# Patient Record
Sex: Female | Born: 1950 | Race: White | Hispanic: No | Marital: Married | Smoking: Never smoker
Health system: Southern US, Community
[De-identification: ages and names within clinical notes are randomized; demographics above are authoritative.]

## PROBLEM LIST (undated history)

## (undated) DIAGNOSIS — K219 Gastro-esophageal reflux disease without esophagitis: Secondary | ICD-10-CM

## (undated) DIAGNOSIS — Z8489 Family history of other specified conditions: Secondary | ICD-10-CM

## (undated) DIAGNOSIS — I10 Essential (primary) hypertension: Secondary | ICD-10-CM

## (undated) DIAGNOSIS — E119 Type 2 diabetes mellitus without complications: Secondary | ICD-10-CM

## (undated) HISTORY — DX: Type 2 diabetes mellitus without complications: E11.9

## (undated) HISTORY — DX: Essential (primary) hypertension: I10

## (undated) HISTORY — PX: TUBAL LIGATION: SHX77

## (undated) HISTORY — PX: GALLBLADDER SURGERY: SHX652

## (undated) HISTORY — DX: Gastro-esophageal reflux disease without esophagitis: K21.9

## (undated) HISTORY — PX: BLADDER REPAIR: SHX76

---

## 1993-05-31 HISTORY — PX: VAGINAL HYSTERECTOMY: SUR661

## 2012-06-10 ENCOUNTER — Emergency Department: Payer: Self-pay | Admitting: Emergency Medicine

## 2012-06-10 LAB — BASIC METABOLIC PANEL
Anion Gap: 10 (ref 7–16)
BUN: 20 mg/dL — ABNORMAL HIGH (ref 7–18)
Calcium, Total: 9.6 mg/dL (ref 8.5–10.1)
Chloride: 103 mmol/L (ref 98–107)
EGFR (African American): >60
EGFR (Non-African Amer.): >60
Glucose: 176 mg/dL — ABNORMAL HIGH (ref 65–99)
Sodium: 138 mmol/L (ref 136–145)

## 2012-06-10 LAB — CBC
MCHC: 33.7 g/dL (ref 32.0–36.0)
Platelet: 230 10*3/uL (ref 150–440)
RBC: 4.35 10*6/uL (ref 3.80–5.20)

## 2015-10-14 ENCOUNTER — Other Ambulatory Visit: Payer: Self-pay | Admitting: Obstetrics & Gynecology

## 2015-10-14 DIAGNOSIS — Z1231 Encounter for screening mammogram for malignant neoplasm of breast: Secondary | ICD-10-CM

## 2015-11-10 ENCOUNTER — Ambulatory Visit
Admission: RE | Admit: 2015-11-10 | Discharge: 2015-11-10 | Disposition: A | Discharge status: Home / Self Care | Payer: BLUE CROSS/BLUE SHIELD | Source: Ambulatory Visit | Attending: Obstetrics & Gynecology | Admitting: Obstetrics & Gynecology

## 2015-11-10 ENCOUNTER — Other Ambulatory Visit: Payer: Self-pay | Admitting: Obstetrics & Gynecology

## 2015-11-10 DIAGNOSIS — Z1231 Encounter for screening mammogram for malignant neoplasm of breast: Secondary | ICD-10-CM

## 2017-10-04 ENCOUNTER — Other Ambulatory Visit: Payer: Self-pay

## 2017-10-04 DIAGNOSIS — Z1211 Encounter for screening for malignant neoplasm of colon: Secondary | ICD-10-CM

## 2017-10-17 ENCOUNTER — Ambulatory Visit: Admit: 2017-10-17 | Admitting: Gastroenterology

## 2017-10-17 SURGERY — COLONOSCOPY WITH PROPOFOL
Anesthesia: Choice

## 2017-10-26 ENCOUNTER — Encounter: Payer: Self-pay | Admitting: Obstetrics & Gynecology

## 2017-10-27 ENCOUNTER — Encounter: Payer: Self-pay | Admitting: Obstetrics & Gynecology

## 2017-10-27 ENCOUNTER — Ambulatory Visit (INDEPENDENT_AMBULATORY_CARE_PROVIDER_SITE_OTHER): Admitting: Obstetrics & Gynecology

## 2017-10-27 VITALS — BP 130/80 | Ht 62.0 in | Wt 188.0 lb

## 2017-10-27 DIAGNOSIS — Z1211 Encounter for screening for malignant neoplasm of colon: Secondary | ICD-10-CM | POA: Diagnosis not present

## 2017-10-27 DIAGNOSIS — Z0000 Encounter for general adult medical examination without abnormal findings: Secondary | ICD-10-CM

## 2017-10-27 DIAGNOSIS — Z1272 Encounter for screening for malignant neoplasm of vagina: Secondary | ICD-10-CM | POA: Diagnosis not present

## 2017-10-27 DIAGNOSIS — Z1239 Encounter for other screening for malignant neoplasm of breast: Secondary | ICD-10-CM

## 2017-10-27 DIAGNOSIS — Z1231 Encounter for screening mammogram for malignant neoplasm of breast: Secondary | ICD-10-CM

## 2017-10-27 NOTE — Patient Instructions (Signed)
PAP every three years Mammogram every year    Call [phone] to schedule at [place] Colonoscopy every 10 years Labs yearly (with PCP) 

## 2017-10-27 NOTE — Progress Notes (Signed)
HPI:      [name] is a 67 y.o. G1P1001 who LMP was in the past, she presents today for her annual examination.  The patient has no complaints today. The patient is sexually active. Herlast pap: approximate date 2016 and was normal and last mammogram: approximate date 2018 and was normal.  The patient does perform self breast exams.  There is no notable family history of breast or ovarian cancer in her family. The patient is not taking hormone replacement therapy. Patient denies post-menopausal vaginal bleeding.   The patient has regular exercise: yes. The patient denies current symptoms of depression.    GYN Hx: Last Colonoscopy:never ago.  Last DEXA: never ago.    PMHx: Past Medical History:  Diagnosis Date  . Diabetes type 2, controlled (HCC)   . Esophageal reflux   . Hypertension    Past Surgical History:  Procedure Laterality Date  . GALLBLADDER SURGERY    . TUBAL LIGATION    . VAGINAL HYSTERECTOMY  1995   Dr Haskel Khan   Family History  Problem Relation Age of Onset  . Breast cancer Cousin   . Hypertension Mother   . Atrial fibrillation Father   . Heart disease Paternal Grandmother   . Kidney cancer Paternal Grandfather    Social History   Tobacco Use  . Smoking status: Never Smoker  . Smokeless tobacco: Never Used  Substance Use Topics  . Alcohol use: Yes    Frequency: Never  . Drug use: Never    Current Outpatient Medications:  .  phentermine 15 MG capsule, Take 15 mg by mouth every morning., Disp: , Rfl:  Allergies: Penicillins  Review of Systems  Constitutional: Negative for chills, fever and malaise/fatigue.  HENT: Negative for congestion, sinus pain and sore throat.   Eyes: Negative for blurred vision and pain.  Respiratory: Negative for cough and wheezing.   Cardiovascular: Negative for chest pain and leg swelling.  Gastrointestinal: Negative for abdominal pain, constipation, diarrhea, heartburn, nausea and vomiting.  Genitourinary: Negative  for dysuria, frequency, hematuria and urgency.  Musculoskeletal: Negative for back pain, joint pain, myalgias and neck pain.  Skin: Negative for itching and rash.  Neurological: Negative for dizziness, tremors and weakness.  Endo/Heme/Allergies: Does not bruise/bleed easily.  Psychiatric/Behavioral: Negative for depression. The patient is not nervous/anxious and does not have insomnia.     Objective: BP 130/80   Ht  (1.575 m)   Wt 188 lb (85.3 kg)   BMI 34.39 kg/m   Filed Weights   10/27/17 1538  Weight: 188 lb (85.3 kg)   Body mass index is 34.39 kg/m. Physical Exam  Constitutional: She is oriented to person, place, and time. She appears well-developed and well-nourished. No distress.  Genitourinary: Rectum normal and vagina normal. Pelvic exam was performed with patient supine. There is no rash or lesion on the right labia. There is no rash or lesion on the left labia. Vagina exhibits no lesion. No bleeding in the vagina. Right adnexum does not display mass and does not display tenderness. Left adnexum does not display mass and does not display tenderness.  Genitourinary Comments: Absent Uterus Absent cervix Vaginal cuff well healed  HENT:  Head: Normocephalic and atraumatic. Head is without laceration.  Right Ear: Hearing normal.  Left Ear: Hearing normal.  Nose: No epistaxis.  No foreign bodies.  Mouth/Throat: Uvula is midline, oropharynx is clear and moist and mucous membranes are normal.  Eyes: Pupils are equal, round, and reactive to light.  Neck: Normal range of motion. Neck supple. No thyromegaly present.  Cardiovascular: Normal rate and regular rhythm. Exam reveals no gallop and no friction rub.  No murmur heard. Pulmonary/Chest: Effort normal and breath sounds normal. No respiratory distress. She has no wheezes. Right breast exhibits no mass, no skin change and no tenderness. Left breast exhibits no mass, no skin change and no tenderness.  Abdominal: Soft. Bowel  sounds are normal. She exhibits no distension. There is no tenderness. There is no rebound.  Musculoskeletal: Normal range of motion.  Neurological: She is alert and oriented to person, place, and time. No cranial nerve deficit.  Skin: Skin is warm and dry.  Psychiatric: She has a normal mood and affect. Judgment normal.  Vitals reviewed.   Assessment: Annual Exam 1. Annual physical exam   2. Screening for vaginal cancer   3. Screening for breast cancer   4. Screen for colon cancer     Plan:            1.  Cervical Screening-  Pap smear done today  2. Breast screening- Exam annually and mammogram scheduled  3. Colonoscopy every 10 years, WILL ARRANGE CONSULT SOON,  Hemoccult testing after age 7  4. Labs managed by PCP  5. Counseling for hormonal therapy: none     F/U  Return in about 1 year (around 10/28/2018) for Annual.  Annamarie Major, MD, [facility], [facility] 10/27/2017  4:17 PM

## 2017-11-01 ENCOUNTER — Telehealth: Payer: Self-pay | Admitting: Gastroenterology

## 2017-11-01 LAB — PAP IG (IMAGE GUIDED): PAP Smear Comment: 0

## 2017-11-01 NOTE — Telephone Encounter (Signed)
Patient LVM for you to call and schedule her colonoscopy

## 2017-11-02 ENCOUNTER — Other Ambulatory Visit: Payer: Self-pay

## 2017-11-02 DIAGNOSIS — Z1211 Encounter for screening for malignant neoplasm of colon: Secondary | ICD-10-CM

## 2017-11-02 NOTE — Telephone Encounter (Signed)
Patient has been scheduled for 11/28/17 [facility]

## 2017-11-24 ENCOUNTER — Telehealth: Payer: Self-pay

## 2017-11-24 NOTE — Telephone Encounter (Signed)
Patients colonoscopy has been rescheduled from 12/08/17 to 04/07/18 due to insurance.  [facility]  Thanks [employer]

## 2018-02-06 ENCOUNTER — Other Ambulatory Visit: Payer: Self-pay

## 2018-02-06 ENCOUNTER — Telehealth: Payer: Self-pay

## 2018-02-06 NOTE — Telephone Encounter (Signed)
Left detailed message for pt to schedule her mammo

## 2018-02-06 NOTE — Telephone Encounter (Signed)
-----   Message from Nadara Mustard, MD sent at 02/01/2018  8:03 AM EDT ----- Regarding: mmg Received notice she has not received MMG yet as ordered at her Annual. Please check and encourage her to do this, and document conversation.

## 2018-03-23 ENCOUNTER — Encounter

## 2018-04-10 ENCOUNTER — Encounter: Payer: Self-pay | Admitting: Obstetrics & Gynecology

## 2018-04-10 ENCOUNTER — Ambulatory Visit
Admission: RE | Admit: 2018-04-10 | Discharge: 2018-04-10 | Disposition: A | Discharge status: Home / Self Care | Payer: Medicare Other | Source: Ambulatory Visit | Attending: Obstetrics & Gynecology | Admitting: Obstetrics & Gynecology

## 2018-04-10 DIAGNOSIS — Z1239 Encounter for other screening for malignant neoplasm of breast: Secondary | ICD-10-CM

## 2018-04-10 DIAGNOSIS — Z1231 Encounter for screening mammogram for malignant neoplasm of breast: Secondary | ICD-10-CM | POA: Diagnosis not present

## 2018-06-02 ENCOUNTER — Telehealth: Payer: Self-pay

## 2018-06-02 DIAGNOSIS — Z1211 Encounter for screening for malignant neoplasm of colon: Secondary | ICD-10-CM

## 2018-06-02 MED ORDER — NA SULFATE-K SULFATE-MG SULF 17.5-3.13-1.6 GM/177ML PO SOLN: 1.0000 | Freq: Once | ORAL | 0 refills | Status: AC

## 2018-06-02 NOTE — Telephone Encounter (Signed)
Received call from patient requesting her rx for her bowel prep to be sent to pharmacy for her.  Asked patient if she had instructions, and she said she did not know if she still had them.  She said that someone told her to call us and we would send rx, she inquired about insurance coverage.  I reviewed the referral and the referral was from 6 months ago, thus a new referral will need to be ordered for precert purposes.  I advised her to bring her insurance card with her to her procedure and told her that she would be contacted if there were questions regarding her insurance.  Thanks [employer]

## 2018-06-05 ENCOUNTER — Encounter: Payer: Self-pay | Admitting: Anesthesiology

## 2018-06-05 ENCOUNTER — Telehealth: Payer: Self-pay | Admitting: Gastroenterology

## 2018-06-05 NOTE — Telephone Encounter (Signed)
Patient called & state she is scheduled for a colonoscopy on 06-09-2018 with Dr Servando Snare. She has a sinus infection & wants to know if it is ok to take Mucinex.

## 2018-06-05 NOTE — Telephone Encounter (Signed)
Patient has been advised that she may take her Mucinex.  Advised her if she is not better closer to procedure to call office to reschedule.  No cancellation fee will be applied.  Thanks [employer]

## 2018-06-07 ENCOUNTER — Telehealth: Payer: Self-pay | Admitting: Gastroenterology

## 2018-06-07 NOTE — Telephone Encounter (Signed)
PT left vm for Marcelino DusterMichelle she still thinks she needs to r/s apt for 06/09/18 due to being sick

## 2018-06-07 NOTE — Telephone Encounter (Signed)
Returned patients call to reschedule her colonoscopy date from 06/09/18 [facility]  She said she would like to call office back to reschedule at a later time.  She is still sick. [facility].  No Cancellation Fee  Thanks Marcelino DusterMichelle

## 2018-06-09 ENCOUNTER — Ambulatory Visit: Admission: RE | Admit: 2018-06-09 | Payer: Medicare Other | Source: Home / Self Care | Admitting: Gastroenterology

## 2018-06-09 SURGERY — COLONOSCOPY WITH PROPOFOL
Anesthesia: Choice

## 2018-08-09 ENCOUNTER — Telehealth: Payer: Self-pay | Admitting: Gastroenterology

## 2018-08-09 ENCOUNTER — Telehealth: Payer: Self-pay

## 2018-08-09 NOTE — Telephone Encounter (Signed)
Pt left vm for Marcelino Duster to reschedule her colonoscopy

## 2018-08-09 NOTE — Telephone Encounter (Signed)
Returned patients call to reschedule her screening colonoscopy.  LVM for her to call office back.  Re-opened referral and its in the work que.  Thanks [employer]

## 2018-08-10 NOTE — Telephone Encounter (Signed)
LVM returning patients call to schedule her colonoscopy.  Thanks Lorain Fettes 

## 2018-08-29 ENCOUNTER — Telehealth: Payer: Self-pay

## 2018-08-29 ENCOUNTER — Other Ambulatory Visit: Payer: Self-pay

## 2018-08-29 DIAGNOSIS — Z1211 Encounter for screening for malignant neoplasm of colon: Secondary | ICD-10-CM

## 2018-08-29 NOTE — Telephone Encounter (Signed)
Contacted patient to discuss and review her colonoscopy instructions for 12/08/18 with Dr. Servando Snare.  LVM for her to call me back to go over them and answer any questions she may have.  Orders entered, letter prepared.  Thanks [employer]

## 2018-11-27 ENCOUNTER — Telehealth: Payer: Self-pay | Admitting: Gastroenterology

## 2018-11-27 NOTE — Telephone Encounter (Signed)
Patient contacted office stated that she would like to cancel her colonoscopy because she is is comfortable having it yet.  [facility] has been made aware of cancellation.  Thanks [employer]

## 2018-11-27 NOTE — Telephone Encounter (Signed)
Pt is calling to cancel her procedure for 12/08/18 she does not feel comfortable to do it yet and states she will call us back to reschedule

## 2018-12-05 ENCOUNTER — Other Ambulatory Visit: Admission: RE | Admit: 2018-12-05 | Source: Ambulatory Visit

## 2018-12-08 ENCOUNTER — Ambulatory Visit: Admit: 2018-12-08 | Admitting: Gastroenterology

## 2018-12-08 SURGERY — COLONOSCOPY WITH PROPOFOL
Anesthesia: Choice

## 2020-09-17 ENCOUNTER — Other Ambulatory Visit: Payer: Self-pay | Admitting: Internal Medicine

## 2020-09-17 DIAGNOSIS — Z1231 Encounter for screening mammogram for malignant neoplasm of breast: Secondary | ICD-10-CM

## 2020-09-25 ENCOUNTER — Other Ambulatory Visit: Payer: Self-pay

## 2020-09-25 ENCOUNTER — Ambulatory Visit
Admission: RE | Admit: 2020-09-25 | Discharge: 2020-09-25 | Disposition: A | Discharge status: Home / Self Care | Payer: Medicare Other | Source: Ambulatory Visit | Attending: Internal Medicine | Admitting: Internal Medicine

## 2020-09-25 DIAGNOSIS — Z1231 Encounter for screening mammogram for malignant neoplasm of breast: Secondary | ICD-10-CM

## 2021-08-19 ENCOUNTER — Other Ambulatory Visit: Payer: Self-pay | Admitting: Internal Medicine

## 2021-08-19 DIAGNOSIS — E2839 Other primary ovarian failure: Secondary | ICD-10-CM

## 2021-08-19 DIAGNOSIS — Z1231 Encounter for screening mammogram for malignant neoplasm of breast: Secondary | ICD-10-CM

## 2021-09-08 ENCOUNTER — Telehealth: Payer: Self-pay

## 2021-09-08 NOTE — Telephone Encounter (Signed)
CALLED PATIENT NO ANSWER LEFT VOICEMAIL FOR A CALL BACK ? ?

## 2021-10-28 ENCOUNTER — Ambulatory Visit
Admission: RE | Admit: 2021-10-28 | Discharge: 2021-10-28 | Disposition: A | Discharge status: Home / Self Care | Source: Ambulatory Visit | Attending: Internal Medicine | Admitting: Internal Medicine

## 2021-10-28 DIAGNOSIS — E2839 Other primary ovarian failure: Secondary | ICD-10-CM | POA: Insufficient documentation

## 2021-10-28 DIAGNOSIS — Z1231 Encounter for screening mammogram for malignant neoplasm of breast: Secondary | ICD-10-CM | POA: Diagnosis present

## 2022-01-07 ENCOUNTER — Encounter: Payer: Self-pay | Admitting: Internal Medicine

## 2022-01-08 ENCOUNTER — Telehealth: Payer: Self-pay

## 2022-01-08 ENCOUNTER — Other Ambulatory Visit: Payer: Self-pay

## 2022-01-08 DIAGNOSIS — Z1211 Encounter for screening for malignant neoplasm of colon: Secondary | ICD-10-CM

## 2022-01-08 MED ORDER — NA SULFATE-K SULFATE-MG SULF 17.5-3.13-1.6 GM/177ML PO SOLN: 1.0000 | Freq: Once | ORAL | 0 refills | Status: AC

## 2022-01-08 NOTE — Telephone Encounter (Signed)
Gastroenterology Pre-Procedure Review  Request Date: 02/19/22 Requesting Physician: Dr. Servando Snare  PATIENT REVIEW QUESTIONS: The patient responded to the following health history questions as indicated:    1. Are you having any GI issues? no 2. Do you have a personal history of Polyps? no 3. Do you have a family history of Colon Cancer or Polyps? no 4. Diabetes Mellitus? yes (type 2 takes metformin has been advised to hold 2 days prior to colonoscopy per anesthesia guidelines) 5. Joint replacements in the past 12 months?no 6. Major health problems in the past 3 months?no 7. Any artificial heart valves, MVP, or defibrillator?no    MEDICATIONS & ALLERGIES:    Patient reports the following regarding taking any anticoagulation/antiplatelet therapy:   Plavix, Coumadin, Eliquis, Xarelto, Lovenox, Pradaxa, Brilinta, or Effient? no Aspirin? no  Patient confirms/reports the following medications:  Current Outpatient Medications  Medication Sig Dispense Refill   naproxen sodium (ALEVE) 220 MG tablet Take 220 mg by mouth daily as needed.     phentermine 15 MG capsule Take 15 mg by mouth every morning.     No current facility-administered medications for this visit.    Patient confirms/reports the following allergies:  Allergies  Allergen Reactions   Penicillins Rash    No orders of the defined types were placed in this encounter.   AUTHORIZATION INFORMATION Primary Insurance: 1D#: Group #:  Secondary Insurance: 1D#: Group #:  SCHEDULE INFORMATION: Date: 02/19/22 Time: Location: [facility]

## 2022-02-15 ENCOUNTER — Encounter: Payer: Self-pay | Admitting: Gastroenterology

## 2022-02-16 ENCOUNTER — Encounter: Payer: Self-pay | Admitting: Gastroenterology

## 2022-02-18 NOTE — Anesthesia Preprocedure Evaluation (Addendum)
Anesthesia Evaluation  Patient identified by MRN, date of birth, ID band Patient awake    Reviewed: Allergy & Precautions, NPO status , Patient's Chart, lab work & pertinent test results  Airway Mallampati: III  TM Distance: >3 FB Neck ROM: full    Dental  (+) Missing   Pulmonary neg pulmonary ROS,    Pulmonary exam normal        Cardiovascular Exercise Tolerance: Good Normal cardiovascular exam     Neuro/Psych negative neurological ROS  negative psych ROS   GI/Hepatic negative GI ROS, Neg liver ROS,   Endo/Other  diabetes, Well Controlled, Type 2, Oral Hypoglycemic Agents  Renal/GU negative Renal ROS  negative genitourinary   Musculoskeletal   Abdominal Normal abdominal exam  (+)   Peds  Hematology negative hematology ROS (+)   Anesthesia Other Findings Past Medical History: No date: Diabetes type 2, controlled (HCC) No date: Esophageal reflux No date: Family history of adverse reaction to anesthesia     Comment:  Sister - PONV No date: Hypertension  Past Surgical History: No date: BLADDER REPAIR     Comment:  bladder tack No date: GALLBLADDER SURGERY No date: TUBAL LIGATION 1995: VAGINAL HYSTERECTOMY     Comment:  Dr Vernie Ammons  BMI    Body Mass Index: 28.80 kg/m      Reproductive/Obstetrics negative OB ROS                            Anesthesia Physical Anesthesia Plan  ASA: 2  Anesthesia Plan: General   Post-op Pain Management: Minimal or no pain anticipated   Induction: Intravenous  PONV Risk Score and Plan: Propofol infusion and TIVA  Airway Management Planned: Natural Airway  Additional Equipment:   Intra-op Plan:   Post-operative Plan:   Informed Consent: I have reviewed the patients History and Physical, chart, labs and discussed the procedure including the risks, benefits and alternatives for the proposed anesthesia with the patient or authorized  representative who has indicated his/her understanding and acceptance.     Dental Advisory Given  Plan Discussed with: Anesthesiologist, CRNA and Surgeon  Anesthesia Plan Comments:       Anesthesia Quick Evaluation

## 2022-02-19 ENCOUNTER — Ambulatory Visit: Payer: Medicare Other | Admitting: Anesthesiology

## 2022-02-19 ENCOUNTER — Ambulatory Visit (AMBULATORY_SURGERY_CENTER): Payer: Medicare Other | Admitting: Anesthesiology

## 2022-02-19 ENCOUNTER — Other Ambulatory Visit: Payer: Self-pay

## 2022-02-19 ENCOUNTER — Ambulatory Visit
Admission: RE | Admit: 2022-02-19 | Discharge: 2022-02-19 | Disposition: A | Discharge status: Home / Self Care | Payer: Medicare Other | Attending: Gastroenterology | Admitting: Gastroenterology

## 2022-02-19 ENCOUNTER — Encounter
Admission: RE | Disposition: A | Discharge status: Home / Self Care | Payer: Self-pay | Source: Home / Self Care | Attending: Gastroenterology

## 2022-02-19 ENCOUNTER — Encounter: Payer: Self-pay | Admitting: Gastroenterology

## 2022-02-19 DIAGNOSIS — I10 Essential (primary) hypertension: Secondary | ICD-10-CM

## 2022-02-19 DIAGNOSIS — Z7984 Long term (current) use of oral hypoglycemic drugs: Secondary | ICD-10-CM | POA: Insufficient documentation

## 2022-02-19 DIAGNOSIS — Z1211 Encounter for screening for malignant neoplasm of colon: Secondary | ICD-10-CM

## 2022-02-19 DIAGNOSIS — K219 Gastro-esophageal reflux disease without esophagitis: Secondary | ICD-10-CM | POA: Diagnosis not present

## 2022-02-19 DIAGNOSIS — E119 Type 2 diabetes mellitus without complications: Secondary | ICD-10-CM

## 2022-02-19 HISTORY — PX: COLONOSCOPY WITH PROPOFOL: SHX5780

## 2022-02-19 HISTORY — DX: Family history of other specified conditions: Z84.89

## 2022-02-19 LAB — GLUCOSE, CAPILLARY
Glucose-Capillary: 152 mg/dL — ABNORMAL HIGH (ref 70–99)
Glucose-Capillary: 163 mg/dL — ABNORMAL HIGH (ref 70–99)

## 2022-02-19 SURGERY — COLONOSCOPY WITH PROPOFOL
Anesthesia: Monitor Anesthesia Care | Site: Rectum

## 2022-02-19 MED ORDER — PROPOFOL 10 MG/ML IV BOLUS
INTRAVENOUS | Status: DC | PRN
  Administered 2022-02-19 (×2): 25 mg via INTRAVENOUS
  Administered 2022-02-19: 100 mg via INTRAVENOUS
  Administered 2022-02-19: 50 mg via INTRAVENOUS
  Administered 2022-02-19: 25 mg via INTRAVENOUS

## 2022-02-19 MED ORDER — LACTATED RINGERS IV SOLN: INTRAVENOUS | Status: DC

## 2022-02-19 MED ORDER — LACTATED RINGERS IV SOLN: INTRAVENOUS | Status: DC | PRN

## 2022-02-19 MED ORDER — SODIUM CHLORIDE 0.9 % IV SOLN: INTRAVENOUS | Status: DC

## 2022-02-19 SURGICAL SUPPLY — 21 items

## 2022-02-19 NOTE — Anesthesia Postprocedure Evaluation (Signed)
Anesthesia Post Note  Patient: Crystal Montoya  Procedure(s) Performed: COLONOSCOPY WITH PROPOFOL (Rectum)     Patient location during evaluation: PACU Anesthesia Type: MAC Level of consciousness: awake and alert Pain management: pain level controlled Vital Signs Assessment: post-procedure vital signs reviewed and stable Respiratory status: spontaneous breathing, nonlabored ventilation and respiratory function stable Cardiovascular status: blood pressure returned to baseline and stable Postop Assessment: no apparent nausea or vomiting Anesthetic complications: no   No notable events documented.  [place] Ouch

## 2022-02-19 NOTE — H&P (Signed)
Crystal Lame, MD [facility] [address], [place], [place] [address] Phone: [phone] Fax : [phone]  Primary Care Physician:  Albina Billet, MD Primary Gastroenterologist:  Dr. Allen Norris  Pre-Procedure History & Physical: HPI:  Crystal Montoya is a 71 y.o. female is here for a screening colonoscopy.   Past Medical History:  Diagnosis Date   Diabetes type 2, controlled ([place])    Esophageal reflux    Family history of adverse reaction to anesthesia    Sister - PONV   Hypertension     Past Surgical History:  Procedure Laterality Date   BLADDER REPAIR     bladder tack   GALLBLADDER SURGERY     TUBAL LIGATION     VAGINAL HYSTERECTOMY  1995   Dr Vernie Ammons    Prior to Admission medications   Medication Sig Start Date End Date Taking? Authorizing Provider  chlorpheniramine (CHLOR-TRIMETON) 2 MG/5ML syrup Take 2 mg by mouth every 4 (four) hours as needed for allergies.   Yes [provider]  Cinnamon 500 MG capsule Take 2,000 mg by mouth daily.   Yes [provider]  docusate sodium (COLACE) 100 MG capsule Take 100 mg by mouth 2 (two) times daily.   Yes [provider]  lisinopril (ZESTRIL) 10 MG tablet Take 10 mg by mouth daily. 08/25/21  Yes [provider]  metFORMIN (GLUCOPHAGE) 500 MG tablet TAKE 1 TABLET BY MOUTH EVERY MORNING AND 2 TABLETS EVERY EVENING 08/21/21  Yes [provider]  naproxen sodium (ALEVE) 220 MG tablet Take 220 mg by mouth daily as needed.   Yes [provider]  Probiotic Product (ALIGN PO) Take by mouth daily.   Yes [provider]  rosuvastatin (CRESTOR) 10 MG tablet Take 10 mg by mouth at bedtime. 08/25/21  Yes [provider]    Allergies as of 01/08/2022 - Review Complete 06/02/2018  Allergen Reaction Noted   Penicillins Rash 10/26/2017    Family History  Problem Relation Age of Onset   Hypertension Mother    Atrial fibrillation Father    Heart disease Paternal  Grandmother    Kidney cancer Paternal Grandfather     Social History   Socioeconomic History   Marital status: Married    Spouse name: Not on file   Number of children: Not on file   Years of education: Not on file   Highest education level: Not on file  Occupational History   Not on file  Tobacco Use   Smoking status: Never   Smokeless tobacco: Never  Vaping Use   Vaping Use: Never used  Substance and Sexual Activity   Alcohol use: Yes    Comment: rarely   Drug use: Never   Sexual activity: Yes  Other Topics Concern   Not on file  Social History Narrative   Not on file   Social Determinants of Health   Financial Resource Strain: Not on file  Food Insecurity: Not on file  Transportation Needs: Not on file  Physical Activity: Not on file  Stress: Not on file  Social Connections: Not on file  Intimate Partner Violence: Not on file    Review of Systems: See HPI, otherwise negative ROS  Physical Exam: BP (!) 150/69   Pulse 83   Temp (!) 97 F (36.1 C) (Temporal)   Resp 16   Ht 5' 2.5" (1.588 m)   Wt 73.5 kg   SpO2 96%   BMI 29.16 kg/m  General:   Alert,  pleasant and cooperative in NAD Head:  Normocephalic and atraumatic. Neck:  Supple; no masses or thyromegaly. Lungs:  Clear throughout to auscultation.    Heart:  Regular rate and rhythm. Abdomen:  Soft, nontender and nondistended. Normal bowel sounds, without guarding, and without rebound.   Neurologic:  Alert and  oriented x4;  grossly normal neurologically.  Impression/Plan: Crystal Montoya is now here to undergo a screening colonoscopy.  Risks, benefits, and alternatives regarding colonoscopy have been reviewed with the patient.  Questions have been answered.  All parties agreeable.

## 2022-02-19 NOTE — Transfer of Care (Signed)
Immediate Anesthesia Transfer of Care Note  Patient: Crystal Montoya  Procedure(s) Performed: COLONOSCOPY WITH PROPOFOL (Rectum)  Patient Location: PACU  Anesthesia Type: MAC  Level of Consciousness: awake, alert  and patient cooperative  Airway and Oxygen Therapy: Patient Spontanous Breathing and Patient connected to supplemental oxygen  Post-op Assessment: Post-op Vital signs reviewed, Patient's Cardiovascular Status Stable, Respiratory Function Stable, Patent Airway and No signs of Nausea or vomiting  Post-op Vital Signs: Reviewed and stable  Complications: No notable events documented.

## 2022-02-19 NOTE — Op Note (Signed)
[facility] Patient Name: Crystal Montoya Procedure Date: 02/19/2022 7:16 AM MRN: [id number] Account #: [id number] Date of Birth: May 05, 1951 Admit Type: Outpatient Age: 71 Room: [facility] OR ROOM 01 Gender: Female Note Status: Finalized Instrument Name: [id number] Procedure:             Colonoscopy Indications:           Screening for colorectal malignant neoplasm Providers:             Midge Minium MD, MD Referring MD:          Jillene Bucks. Arlana Pouch, MD (Referring MD) Medicines:             Propofol per Anesthesia Complications:         No immediate complications. Procedure:             Pre-Anesthesia Assessment:                        - Prior to the procedure, a History and Physical was                         performed, and patient medications and allergies were                         reviewed. The patient's tolerance of previous                         anesthesia was also reviewed. The risks and benefits                         of the procedure and the sedation options and risks                         were discussed with the patient. All questions were                         answered, and informed consent was obtained. Prior                         Anticoagulants: The patient has taken no previous                         anticoagulant or antiplatelet agents. ASA Grade                         Assessment: II - A patient with mild systemic disease.                         After reviewing the risks and benefits, the patient                         was deemed in satisfactory condition to undergo the                         procedure.                        After obtaining informed consent, the colonoscope was  passed under direct vision. Throughout the procedure,                         the patient's blood pressure, pulse, and oxygen                         saturations were monitored continuously. The                         Colonoscope was  introduced through the anus and                         advanced to the the cecum, identified by appendiceal                         orifice and ileocecal valve. The colonoscopy was                         performed without difficulty. The patient tolerated                         the procedure well. The quality of the bowel                         preparation was excellent. Findings:      The perianal and digital rectal examinations were normal.      The colon (entire examined portion) appeared normal. Impression:            - The entire examined colon is normal.                        - No specimens collected. Recommendation:        - Discharge patient to home.                        - Resume previous diet.                        - Continue present medications.                        - Repeat colonoscopy is not recommended due to current                         age (31 years or older) for screening purposes. Procedure Code(s):     --- Professional ---                        [id number], Colonoscopy, flexible; diagnostic, including                         collection of specimen(s) by brushing or washing, when                         performed (separate procedure) Diagnosis Code(s):     --- Professional ---                        Z12.11, Encounter for screening for malignant neoplasm  of colon CPT copyright 2019 American Medical Association. All rights reserved. The codes documented in this report are preliminary and upon coder review may  be revised to meet current compliance requirements. Midge Minium MD, MD 02/19/2022 7:51:43 AM This report has been signed electronically. Number of Addenda: 0 Note Initiated On: 02/19/2022 7:16 AM Scope Withdrawal Time: 0 hours 7 minutes 29 seconds  Total Procedure Duration: 0 hours 12 minutes 5 seconds  Estimated Blood Loss:  Estimated blood loss: none.      [facility]

## 2023-02-22 ENCOUNTER — Other Ambulatory Visit: Payer: Self-pay | Admitting: Internal Medicine

## 2023-02-22 DIAGNOSIS — Z1231 Encounter for screening mammogram for malignant neoplasm of breast: Secondary | ICD-10-CM

## 2023-03-07 ENCOUNTER — Ambulatory Visit
Admission: RE | Admit: 2023-03-07 | Discharge: 2023-03-07 | Disposition: A | Discharge status: Home / Self Care | Payer: Medicare Other | Source: Ambulatory Visit | Attending: Internal Medicine | Admitting: Internal Medicine

## 2023-03-07 DIAGNOSIS — Z1231 Encounter for screening mammogram for malignant neoplasm of breast: Secondary | ICD-10-CM | POA: Insufficient documentation

## 2023-08-01 IMAGING — MG MM DIGITAL SCREENING BILAT W/ TOMO AND CAD
6 of 10 series · 6 of 30 positions shown · non-contrast
Comparison: Previous exam(s).

CLINICAL DATA: Screening.

EXAM:
DIGITAL SCREENING BILATERAL MAMMOGRAM WITH TOMOSYNTHESIS AND CAD
TECHNIQUE: Bilateral screening digital craniocaudal and mediolateral oblique
mammograms were obtained. Bilateral screening digital breast
tomosynthesis was performed. The images were evaluated with
computer-aided detection.

[L MLO synth-2D (1 of 2)]
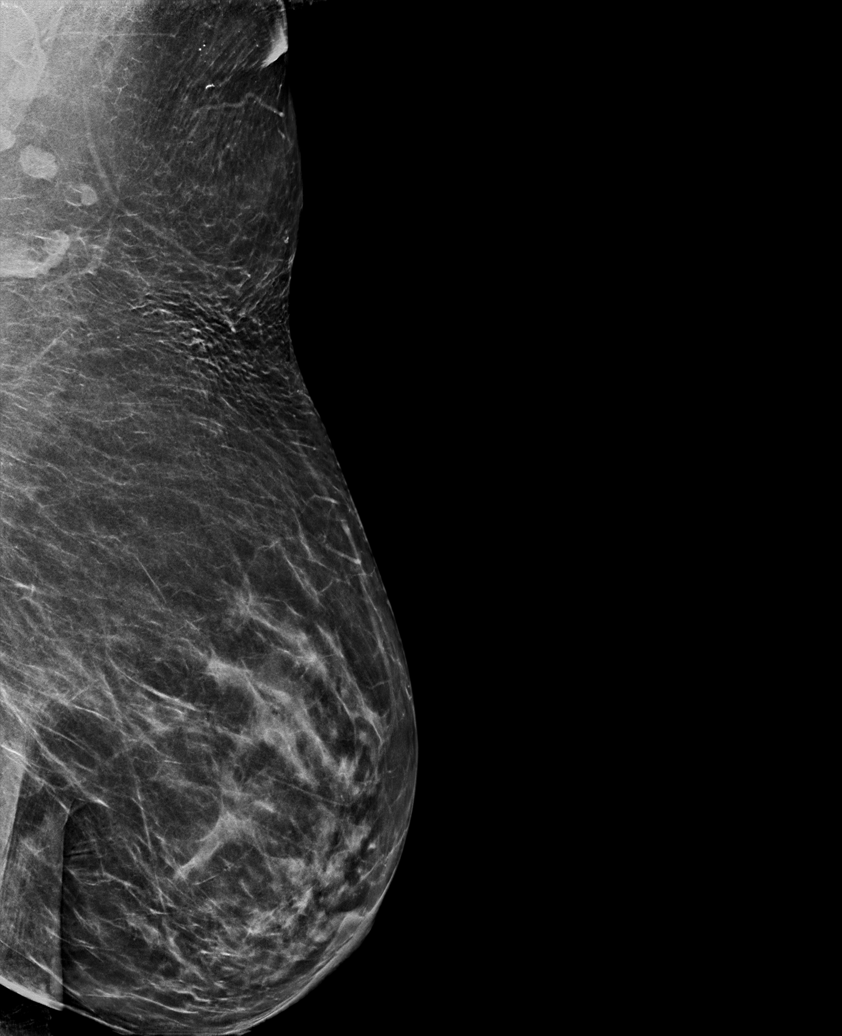

[R MLO synth-2D]
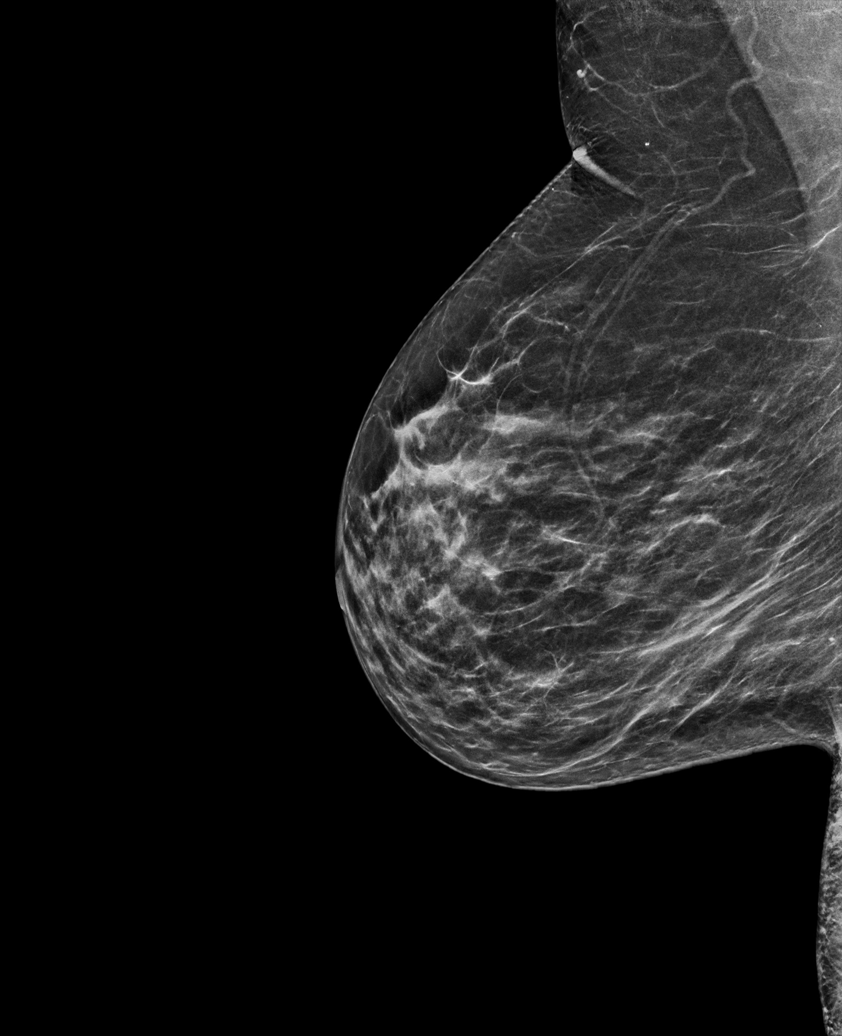

[R CC synth-2D]
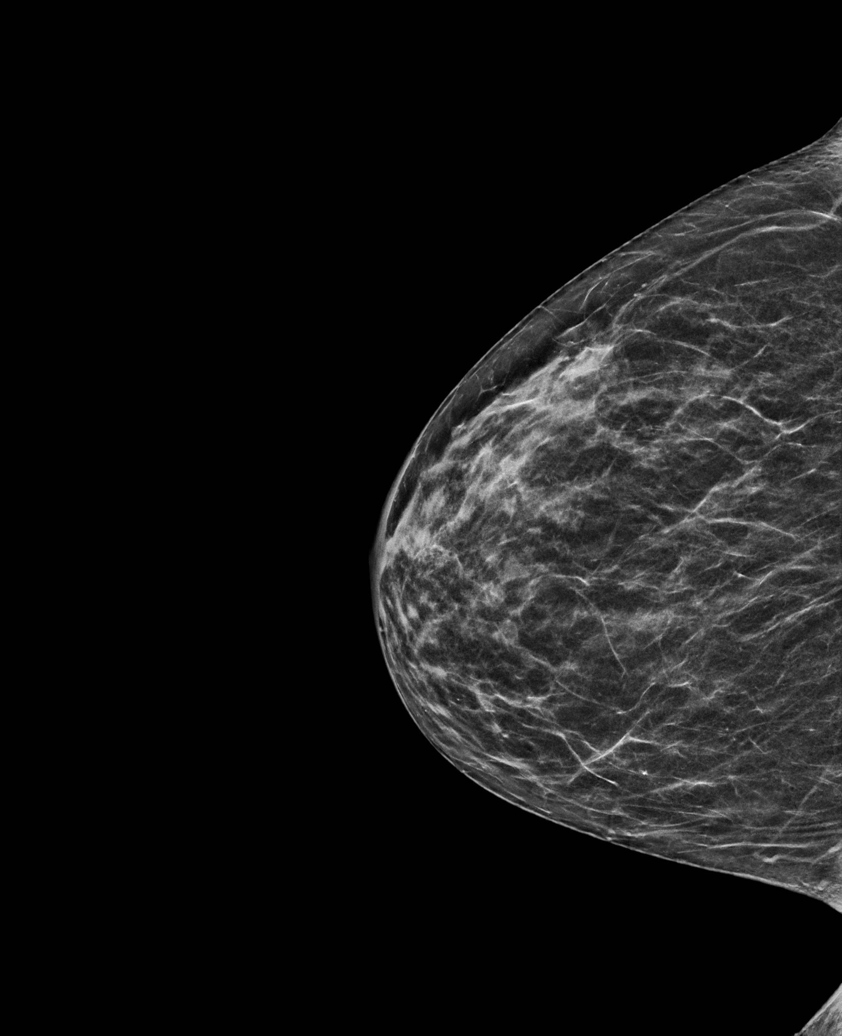

[L MLO synth-2D (2 of 2)]
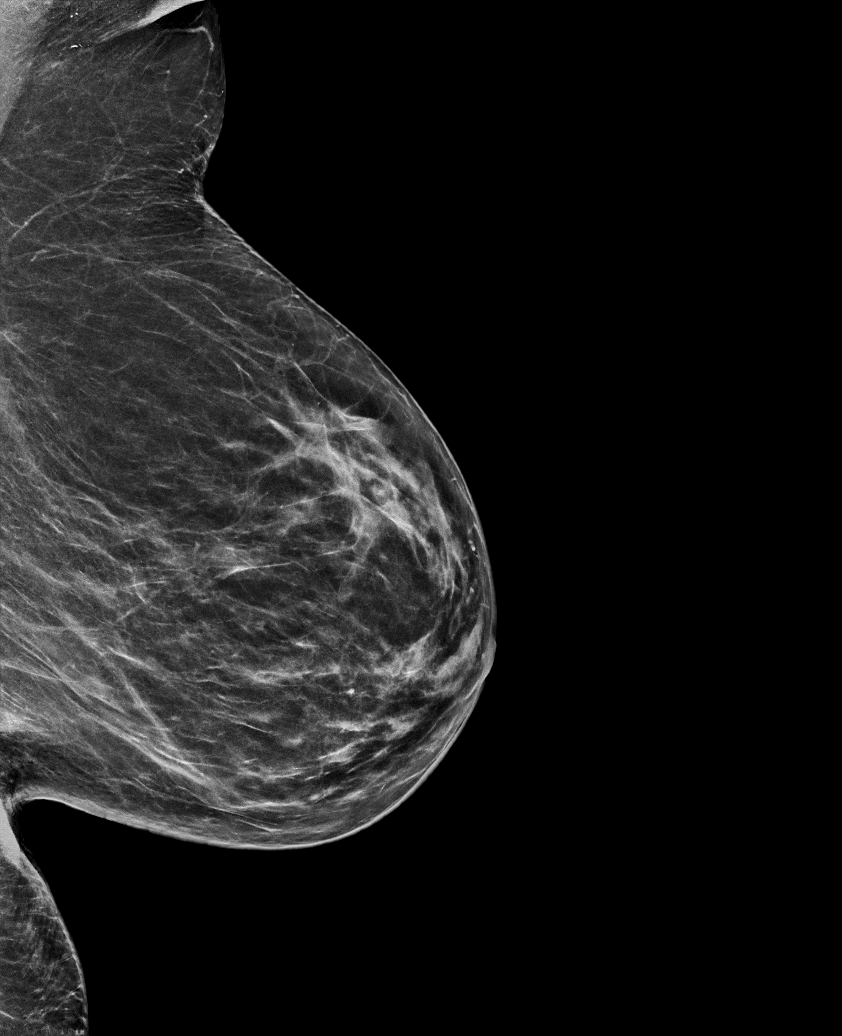

[L CC synth-2D]
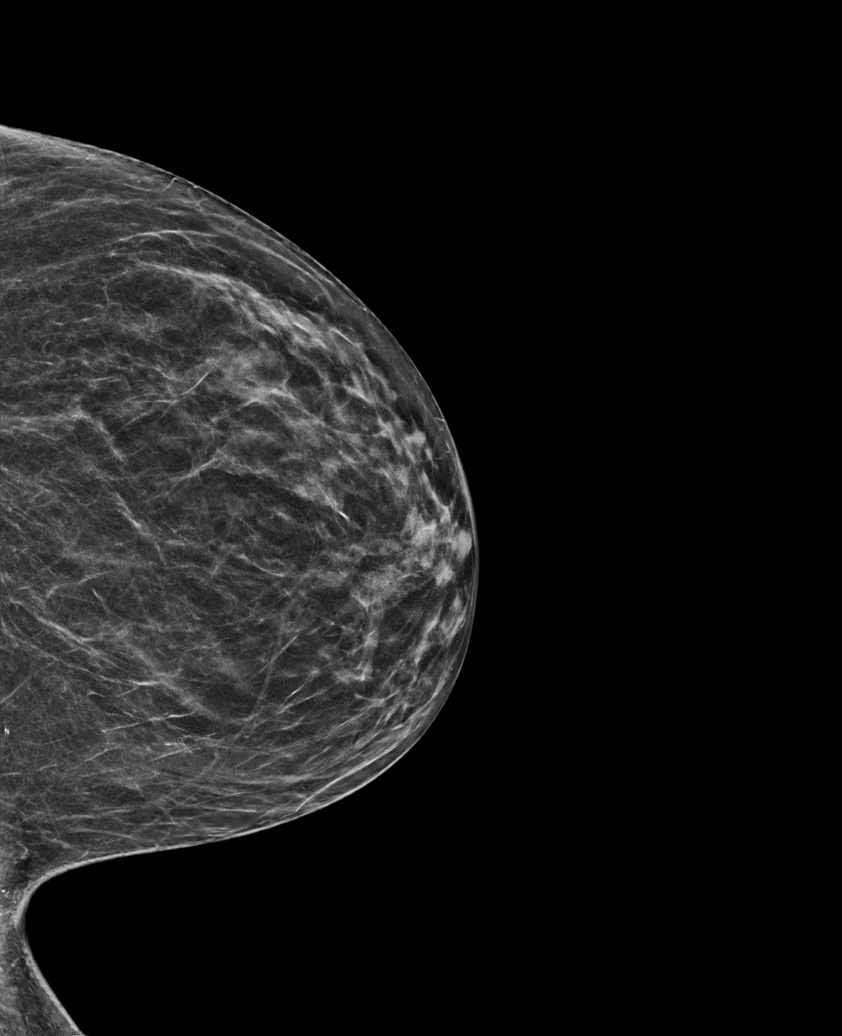

[R CC tomo · tomo slice 25/49.0]
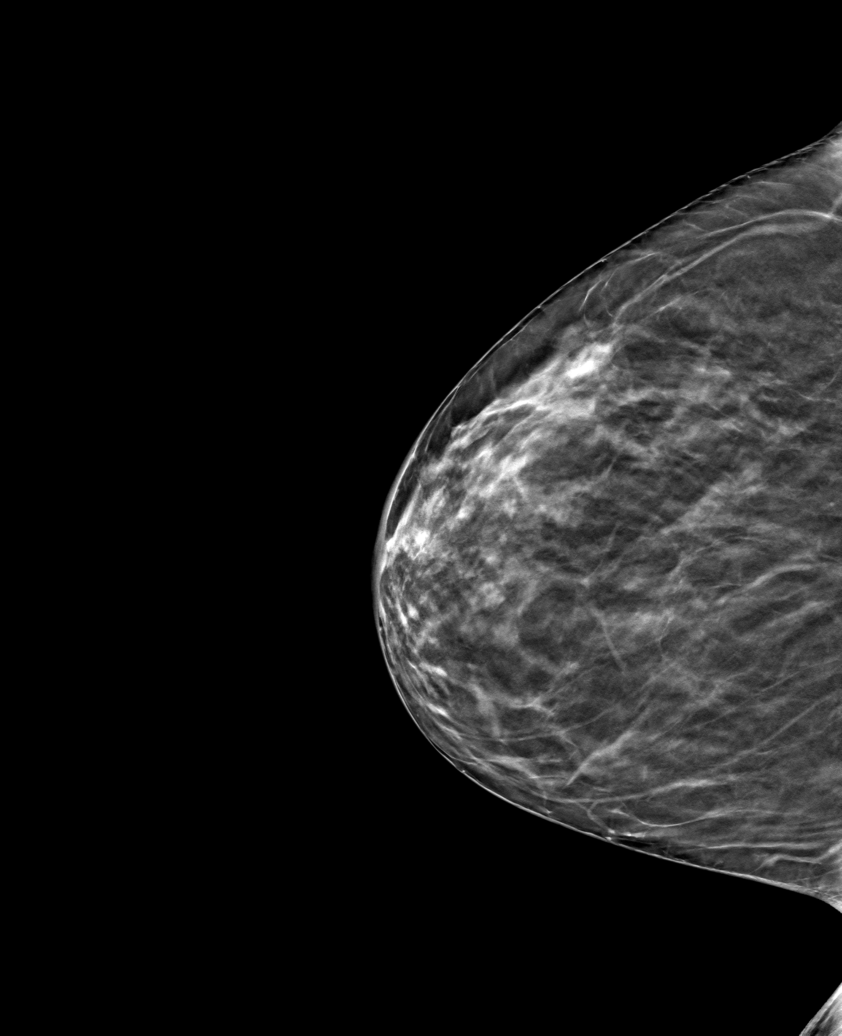

[6 of 30 positions shown; findings below may reference images not displayed]

ACR Breast Density Category b: There are scattered areas of
fibroglandular density.
FINDINGS: There are no findings suspicious for malignancy.
IMPRESSION: No mammographic evidence of malignancy. A result letter of this
screening mammogram will be mailed directly to the patient.

RECOMMENDATION:
Screening mammogram in one year. (Code:51-O-LD2)

BI-RADS CATEGORY  1: Negative.

## 2024-01-31 ENCOUNTER — Other Ambulatory Visit: Payer: Self-pay | Admitting: Nurse Practitioner

## 2024-01-31 DIAGNOSIS — Z1231 Encounter for screening mammogram for malignant neoplasm of breast: Secondary | ICD-10-CM

## 2024-03-07 ENCOUNTER — Ambulatory Visit
Admission: RE | Admit: 2024-03-07 | Discharge: 2024-03-07 | Disposition: A | Discharge status: Home / Self Care | Source: Ambulatory Visit | Attending: Nurse Practitioner | Admitting: Nurse Practitioner

## 2024-03-07 DIAGNOSIS — Z1231 Encounter for screening mammogram for malignant neoplasm of breast: Secondary | ICD-10-CM | POA: Diagnosis present
